# Patient Record
Sex: Male | Born: 1970 | Race: White | Hispanic: No | Marital: Married | Smoking: Never smoker
Health system: Southern US, Community
[De-identification: ages and names within clinical notes are randomized; demographics above are authoritative.]

## PROBLEM LIST (undated history)

## (undated) DIAGNOSIS — E785 Hyperlipidemia, unspecified: Secondary | ICD-10-CM

## (undated) DIAGNOSIS — R011 Cardiac murmur, unspecified: Secondary | ICD-10-CM

## (undated) DIAGNOSIS — G4730 Sleep apnea, unspecified: Secondary | ICD-10-CM

## (undated) HISTORY — DX: Cardiac murmur, unspecified: R01.1

## (undated) HISTORY — PX: APPENDECTOMY: SHX54

## (undated) HISTORY — PX: ARTHROSCOPIC REPAIR ACL: SUR80

## (undated) HISTORY — DX: Hyperlipidemia, unspecified: E78.5

## (undated) HISTORY — PX: KNEE ARTHROSCOPY: SUR90

---

## 2006-11-13 ENCOUNTER — Ambulatory Visit: Payer: Self-pay | Admitting: Family Medicine

## 2008-10-29 ENCOUNTER — Ambulatory Visit: Payer: Self-pay | Admitting: Family Medicine

## 2012-05-09 ENCOUNTER — Ambulatory Visit: Payer: Self-pay | Admitting: General Practice

## 2012-06-13 ENCOUNTER — Ambulatory Visit: Payer: Self-pay | Admitting: General Practice

## 2012-08-01 ENCOUNTER — Ambulatory Visit: Payer: Self-pay | Admitting: Family Medicine

## 2012-09-05 DIAGNOSIS — N4340 Spermatocele of epididymis, unspecified: Secondary | ICD-10-CM | POA: Insufficient documentation

## 2012-09-05 DIAGNOSIS — K4090 Unilateral inguinal hernia, without obstruction or gangrene, not specified as recurrent: Secondary | ICD-10-CM | POA: Insufficient documentation

## 2014-06-04 NOTE — Op Note (Signed)
PATIENT NAME:  Troy Gibson, Troy Gibson MR#:  [id number] DATE OF BIRTH:  1971/01/02  DATE OF PROCEDURE:  06/13/2012  PREOPERATIVE DIAGNOSIS:  Internal derangement of the right knee.   POSTOPERATIVE DIAGNOSIS:  Tear of the posterior horn of the medial meniscus, right knee.   PROCEDURE PERFORMED:  Right knee arthroscopy, partial medial meniscectomy.   SURGEON:  Laurice Record. Holley Bouche., MD  ANESTHESIA:  General.   ESTIMATED BLOOD LOSS:  Minimal.   TOURNIQUET TIME:  Not used.   DRAINS:  None.   INDICATIONS FOR SURGERY:  The patient is a 44 year old male who has been seen for complaints of medial pain and swelling of the right knee. He has a remote history of right ACL reconstruction and meniscus repair. The patient has not seen any significant improvement despite conservative and nonsurgical intervention. After a discussion of the risks and benefits of surgical intervention, the patient expressed understanding of the risks and benefits and agreed with plans for surgical intervention.   PROCEDURE IN DETAIL:  The patient was brought into the Operating Room and, after adequate general anesthesia was achieved, a tourniquet was placed on the patient's right thigh and the leg was placed in a leg holder. All by prominences were well padded. The patient's right knee and leg were cleaned and prepped with alcohol and DuraPrep draped in the usual sterile fashion. A "time-out" was performed as per usual protocol. The anticipated portal sites were injected with 0.25% Marcaine with epinephrine. An anterolateral portal was created and a cannula was inserted. The scope was inserted and the knee was distended with fluid using the  Stryker pump. The scope was advanced in the medial gutter into the medial compartment of the knee. Under visualization with the scope, an anteromedial portal was created and a hook probe was inserted. Inspection of the medial compartment showed the articular surface to be in good condition. The anterior  horn was visualized and probed and felt to be stable. A complex tear of the posterior horn of the medial meniscus was noted. This involved both a radial tear as well as a component of a horizontal cleavage tear. The area of the tear was debrided using meniscal punches and a 4.5-mm shaver. Final contouring was performed using a 50-degree ArthroCare wand. The remaining rim of meniscus was visualized and probed and felt to be stable. The articular surface was felt to be in good condition to both the femoral and tibial aspects. The scope was then advanced into the intercondylar region. The anterior cruciate ligament graft was visualized and probed and noted be stable. A Lachman's test performed while visualizing the graft showed the graft to be functioning well without any evidence of impingement. The scope was removed from the anterolateral portal and reinserted via the anteromedial portal so as to better visualize the lateral compartment. The articular surface the lateral compartment was in good condition. The lateral meniscus was visualized and probed and felt to be stable. Finally, the scope was positioned so as to visualize the patellofemoral articulation. Good patellar tracking was noted. The articular surface was in good condition. The knee was irrigated with copious amounts of fluid and then suctioned dry. The anterolateral portal was reapproximated using #3-0 nylon. A combination of 0.25% Marcaine with epinephrine and 4 mg morphine was injected via the scope. The scope was removed and the anteromedial portal was reapproximated using #3-0 nylon. A sterile dressing was applied followed by application of an ice wrap.   The patient tolerated the procedure well. He was  transported to the Recovery Room in stable condition.  ____________________________ Laurice Record. Holley Bouche., MD jph:jm D: 06/13/2012 17:34:19 ET T: 06/14/2012 10:10:37 ET JOB#: [id number]  cc: Jeneen Rinks P. Holley Bouche., MD, <Dictator> Laurice Record Holley Bouche  MD ELECTRONICALLY SIGNED 06/17/2012 21:53

## 2014-11-06 IMAGING — US US PELVIS LIMITED
1 series · 14 of 25 positions shown · non-contrast
Comparison: none

REASON FOR EXAM: teticular pain
COMMENTS:

[Series 1: us pelvis limited · 0.08mm/px · 14 of 45 slices shown]
[im 1/45]
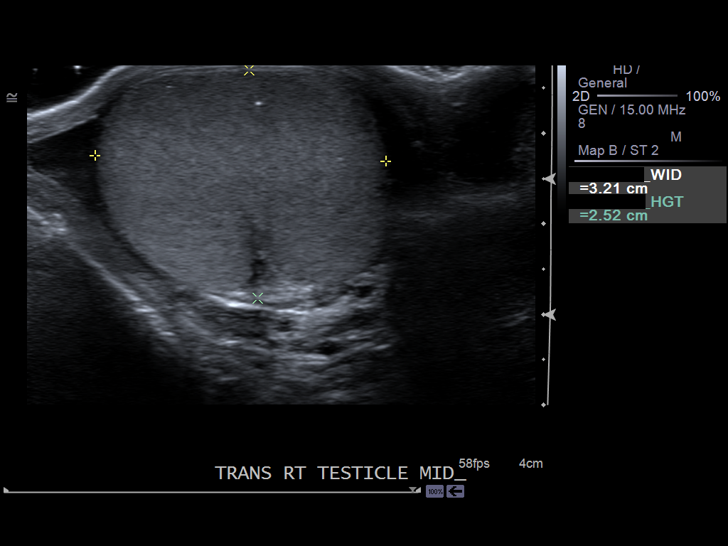
[im 4/45]
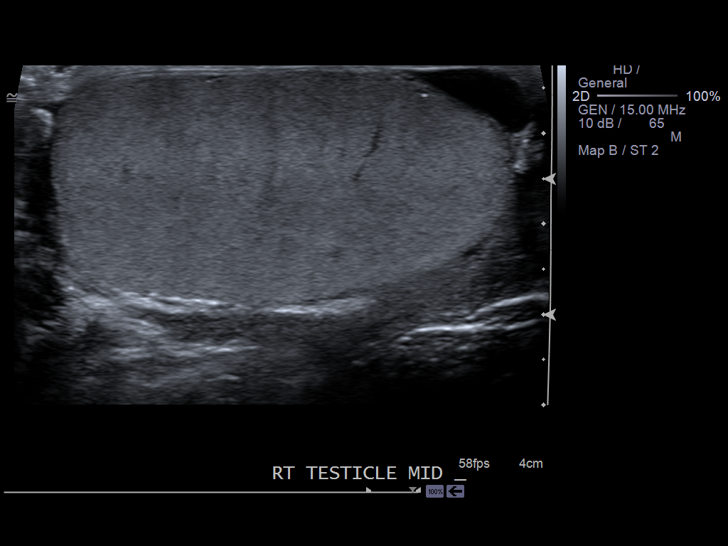
[im 8/45]
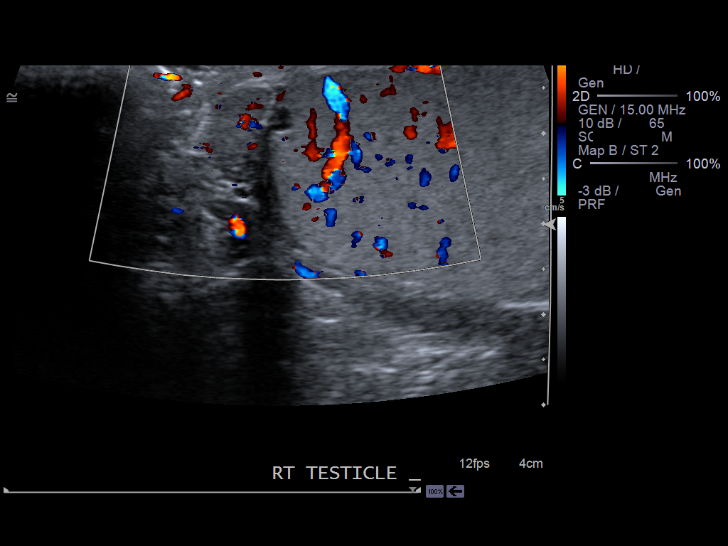
[im 12/45]
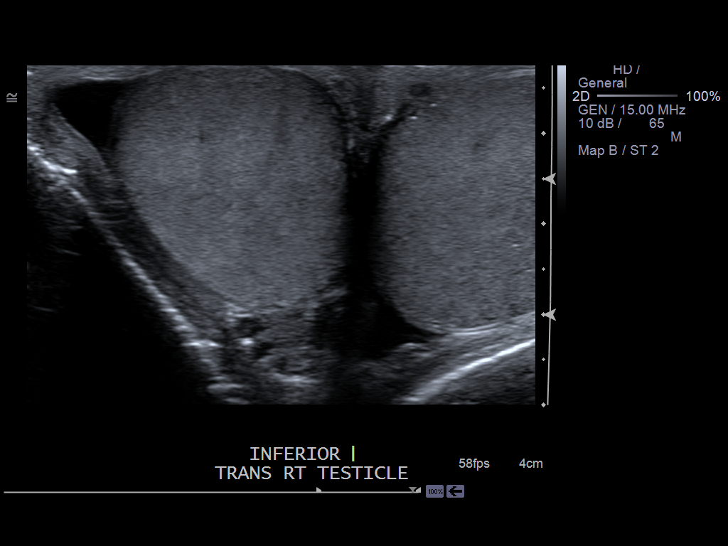
[im 15/45]
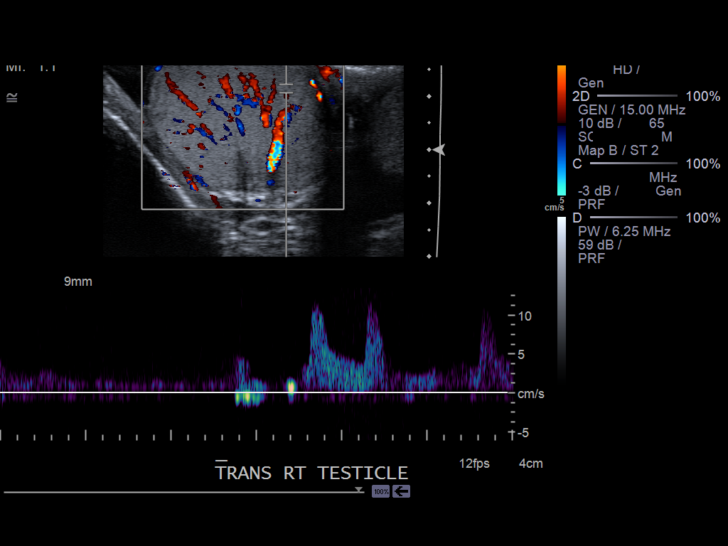
[im 17/45]
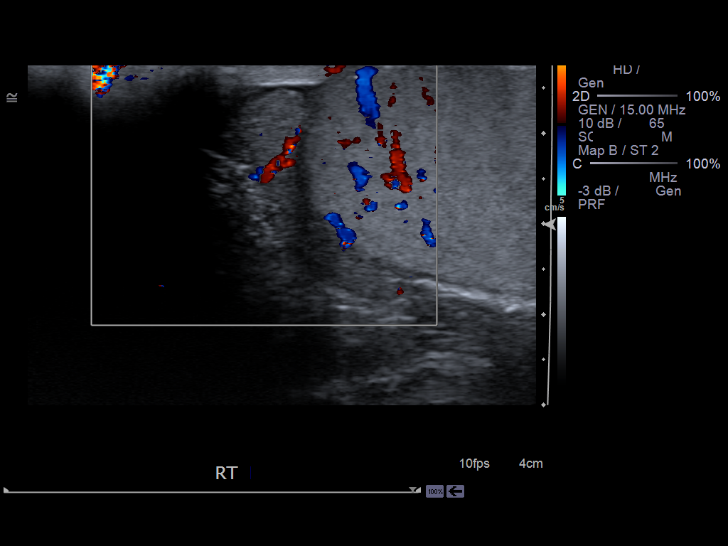
[im 21/45]
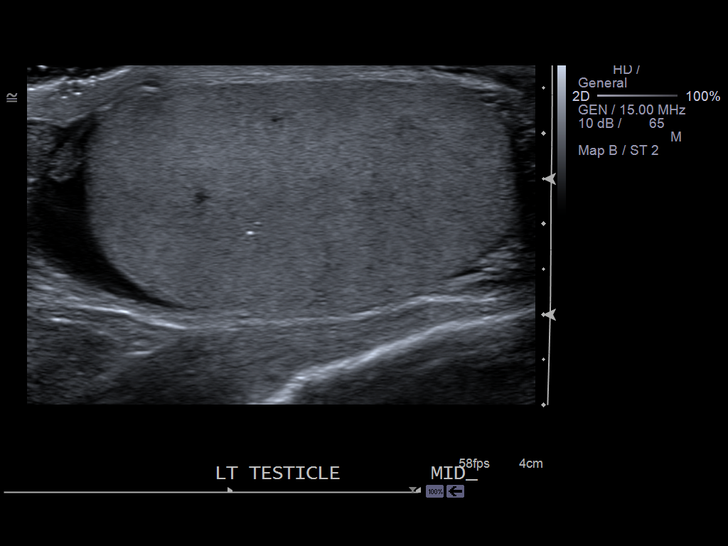
[im 24/45]
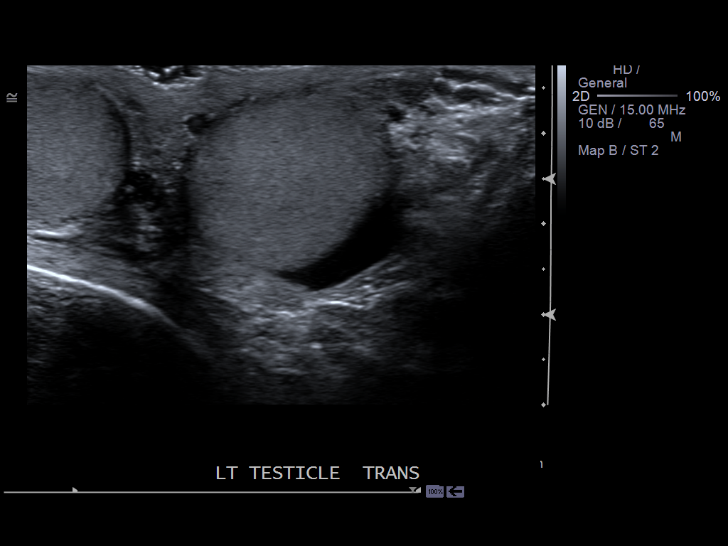
[im 28/45]
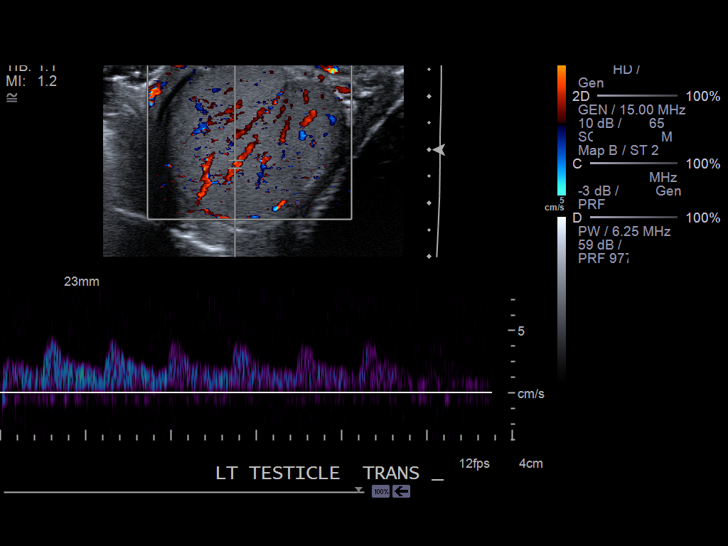
[im 30/45]
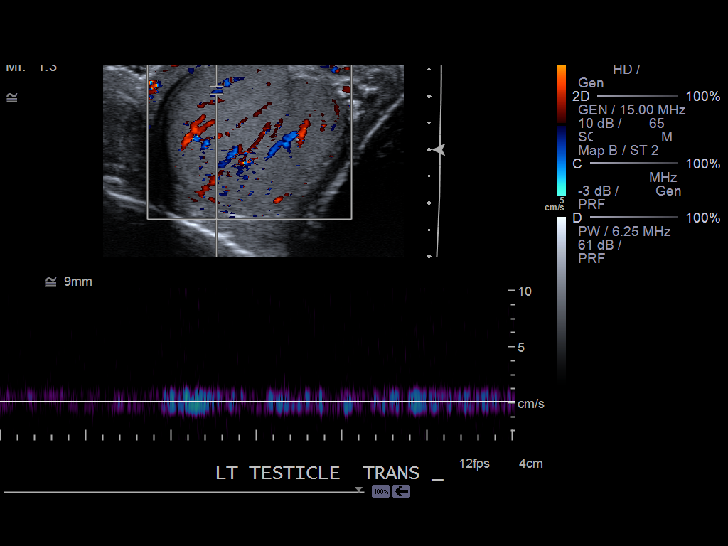
[im 34/45]
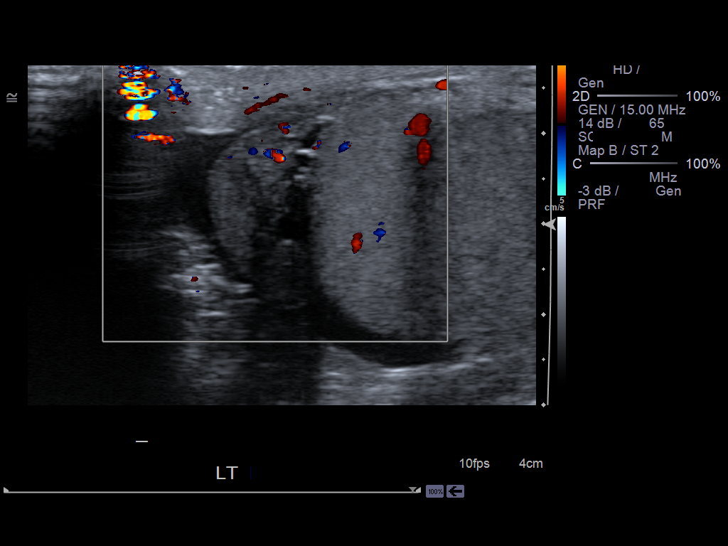
[im 37/45]
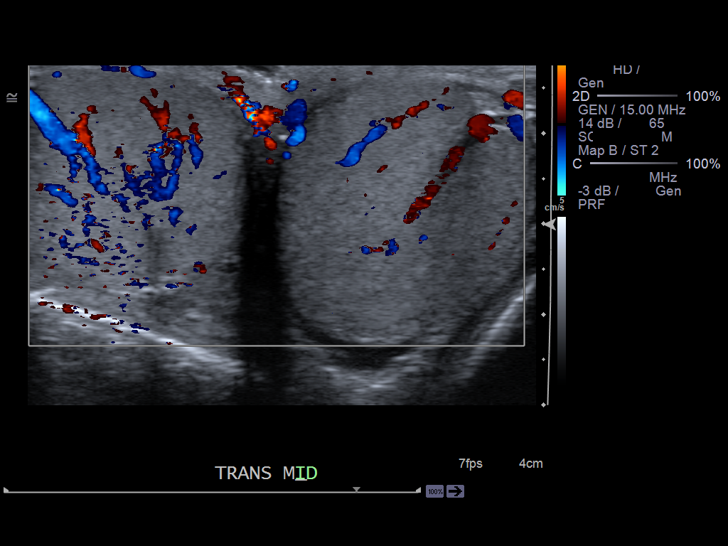
[im 41/45]
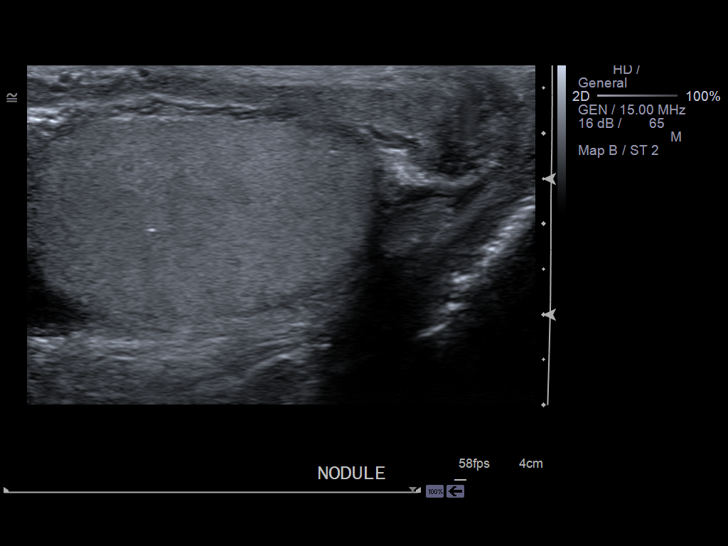
[im 45/45]
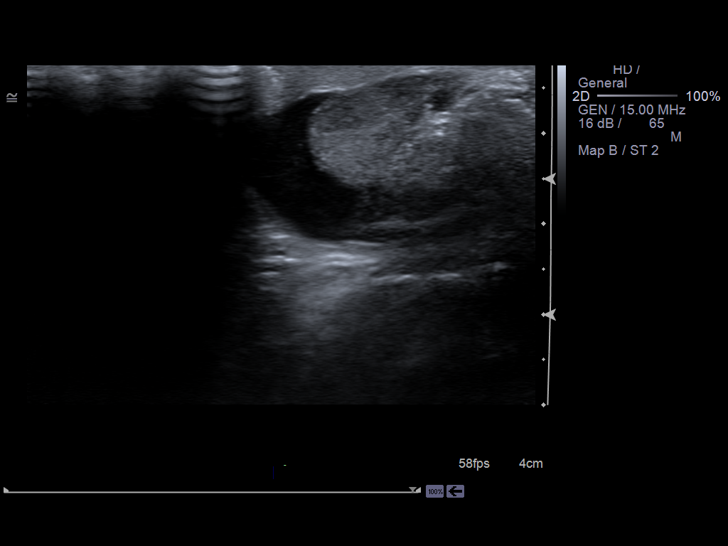

[14 of 25 positions shown; findings below may reference images not displayed]

PROCEDURE:     YOANA - YOANA TESTICULAR  - August 01, 2012  [DATE]

RESULT:     Scrotal ultrasound is performed. The right testicle measures
3.21 x 2.52 x 5.05 cm shows a small cyst at the periphery superiorly and
laterally measuring 2.5 x 2.2 x 2.2 mm. Testicular microlithiasis appears
present in both testicles. The left testicle measures 4.96 x 3.13 x 3.09 cm.
No other solid or cystic testicular lesions are evident. Arterial and venous
blood flow is documented in both testicles. There is a trace hydrocele on
the left. There is a small epididymal cyst on the left measuring 2.6 x 2.7 x
2.5 mm. There is fullness in the epididymal tail on the left side in the
area of the palpable nodule. Blood flow appears to be symmetric when
comparing the left testicle to the right.
IMPRESSION: 1. Fullness of the tail of the epididymis on the left at the site of the
palpable nodule.
2. Bilateral testicular microlithiasis.
3. Small left hydrocele and small left epididymal cyst.
4. Small cyst in the superior lateral aspect of the right testicle.
5. No evidence of testicular torsion.

[REDACTED]

## 2016-05-18 DIAGNOSIS — D2262 Melanocytic nevi of left upper limb, including shoulder: Secondary | ICD-10-CM | POA: Diagnosis not present

## 2016-06-05 DIAGNOSIS — G4733 Obstructive sleep apnea (adult) (pediatric): Secondary | ICD-10-CM | POA: Diagnosis not present

## 2016-06-05 DIAGNOSIS — J301 Allergic rhinitis due to pollen: Secondary | ICD-10-CM | POA: Diagnosis not present

## 2016-07-24 ENCOUNTER — Ambulatory Visit: Payer: 59 | Attending: Otolaryngology

## 2016-07-24 DIAGNOSIS — G4733 Obstructive sleep apnea (adult) (pediatric): Secondary | ICD-10-CM | POA: Diagnosis not present

## 2016-08-02 DIAGNOSIS — G4733 Obstructive sleep apnea (adult) (pediatric): Secondary | ICD-10-CM | POA: Diagnosis not present

## 2016-08-20 DIAGNOSIS — G4733 Obstructive sleep apnea (adult) (pediatric): Secondary | ICD-10-CM | POA: Diagnosis not present

## 2016-09-20 DIAGNOSIS — G4733 Obstructive sleep apnea (adult) (pediatric): Secondary | ICD-10-CM | POA: Diagnosis not present

## 2016-10-21 DIAGNOSIS — G4733 Obstructive sleep apnea (adult) (pediatric): Secondary | ICD-10-CM | POA: Diagnosis not present

## 2016-11-20 DIAGNOSIS — G4733 Obstructive sleep apnea (adult) (pediatric): Secondary | ICD-10-CM | POA: Diagnosis not present

## 2016-12-05 DIAGNOSIS — G4733 Obstructive sleep apnea (adult) (pediatric): Secondary | ICD-10-CM | POA: Diagnosis not present

## 2016-12-21 DIAGNOSIS — G4733 Obstructive sleep apnea (adult) (pediatric): Secondary | ICD-10-CM | POA: Diagnosis not present

## 2017-01-20 DIAGNOSIS — G4733 Obstructive sleep apnea (adult) (pediatric): Secondary | ICD-10-CM | POA: Diagnosis not present

## 2017-02-20 DIAGNOSIS — G4733 Obstructive sleep apnea (adult) (pediatric): Secondary | ICD-10-CM | POA: Diagnosis not present

## 2017-03-01 DIAGNOSIS — D225 Melanocytic nevi of trunk: Secondary | ICD-10-CM | POA: Diagnosis not present

## 2017-03-11 DIAGNOSIS — L723 Sebaceous cyst: Secondary | ICD-10-CM | POA: Diagnosis not present

## 2017-03-15 DIAGNOSIS — L728 Other follicular cysts of the skin and subcutaneous tissue: Secondary | ICD-10-CM | POA: Diagnosis not present

## 2017-03-23 DIAGNOSIS — G4733 Obstructive sleep apnea (adult) (pediatric): Secondary | ICD-10-CM | POA: Diagnosis not present

## 2017-04-20 DIAGNOSIS — G4733 Obstructive sleep apnea (adult) (pediatric): Secondary | ICD-10-CM | POA: Diagnosis not present

## 2017-05-21 DIAGNOSIS — G4733 Obstructive sleep apnea (adult) (pediatric): Secondary | ICD-10-CM | POA: Diagnosis not present

## 2017-07-12 DIAGNOSIS — M79644 Pain in right finger(s): Secondary | ICD-10-CM | POA: Diagnosis not present

## 2017-07-23 DIAGNOSIS — M79644 Pain in right finger(s): Secondary | ICD-10-CM | POA: Diagnosis not present

## 2017-07-23 DIAGNOSIS — M25641 Stiffness of right hand, not elsewhere classified: Secondary | ICD-10-CM | POA: Diagnosis not present

## 2017-07-23 DIAGNOSIS — M20011 Mallet finger of right finger(s): Secondary | ICD-10-CM | POA: Insufficient documentation

## 2017-07-31 DIAGNOSIS — M25641 Stiffness of right hand, not elsewhere classified: Secondary | ICD-10-CM | POA: Diagnosis not present

## 2017-07-31 DIAGNOSIS — M20011 Mallet finger of right finger(s): Secondary | ICD-10-CM | POA: Diagnosis not present

## 2017-08-13 DIAGNOSIS — M20011 Mallet finger of right finger(s): Secondary | ICD-10-CM | POA: Diagnosis not present

## 2017-08-13 DIAGNOSIS — M79644 Pain in right finger(s): Secondary | ICD-10-CM | POA: Diagnosis not present

## 2017-08-28 DIAGNOSIS — M20011 Mallet finger of right finger(s): Secondary | ICD-10-CM | POA: Diagnosis not present

## 2017-08-28 DIAGNOSIS — M25641 Stiffness of right hand, not elsewhere classified: Secondary | ICD-10-CM | POA: Diagnosis not present

## 2017-09-10 DIAGNOSIS — M79644 Pain in right finger(s): Secondary | ICD-10-CM | POA: Diagnosis not present

## 2017-09-10 DIAGNOSIS — M20011 Mallet finger of right finger(s): Secondary | ICD-10-CM | POA: Diagnosis not present

## 2017-09-10 DIAGNOSIS — M25641 Stiffness of right hand, not elsewhere classified: Secondary | ICD-10-CM | POA: Diagnosis not present

## 2017-11-26 DIAGNOSIS — M20011 Mallet finger of right finger(s): Secondary | ICD-10-CM | POA: Diagnosis not present

## 2017-11-26 DIAGNOSIS — M79644 Pain in right finger(s): Secondary | ICD-10-CM | POA: Diagnosis not present

## 2017-12-31 DIAGNOSIS — N401 Enlarged prostate with lower urinary tract symptoms: Secondary | ICD-10-CM | POA: Diagnosis not present

## 2017-12-31 DIAGNOSIS — N469 Male infertility, unspecified: Secondary | ICD-10-CM | POA: Diagnosis not present

## 2017-12-31 DIAGNOSIS — Z125 Encounter for screening for malignant neoplasm of prostate: Secondary | ICD-10-CM | POA: Diagnosis not present

## 2018-03-07 DIAGNOSIS — D2261 Melanocytic nevi of right upper limb, including shoulder: Secondary | ICD-10-CM | POA: Diagnosis not present

## 2018-03-07 DIAGNOSIS — D2262 Melanocytic nevi of left upper limb, including shoulder: Secondary | ICD-10-CM | POA: Diagnosis not present

## 2018-03-07 DIAGNOSIS — D225 Melanocytic nevi of trunk: Secondary | ICD-10-CM | POA: Diagnosis not present

## 2018-03-12 ENCOUNTER — Encounter: Payer: Self-pay | Admitting: *Deleted

## 2018-03-12 DIAGNOSIS — R011 Cardiac murmur, unspecified: Secondary | ICD-10-CM | POA: Insufficient documentation

## 2018-03-12 DIAGNOSIS — Z0000 Encounter for general adult medical examination without abnormal findings: Secondary | ICD-10-CM | POA: Diagnosis not present

## 2018-03-12 DIAGNOSIS — G4733 Obstructive sleep apnea (adult) (pediatric): Secondary | ICD-10-CM | POA: Diagnosis not present

## 2018-03-12 DIAGNOSIS — E7800 Pure hypercholesterolemia, unspecified: Secondary | ICD-10-CM | POA: Diagnosis not present

## 2018-03-21 DIAGNOSIS — R011 Cardiac murmur, unspecified: Secondary | ICD-10-CM | POA: Diagnosis not present

## 2018-04-10 ENCOUNTER — Ambulatory Visit: Payer: 59 | Admitting: Gastroenterology

## 2018-05-13 ENCOUNTER — Ambulatory Visit: Payer: 59 | Admitting: Gastroenterology

## 2019-01-06 ENCOUNTER — Ambulatory Visit: Payer: 59 | Admitting: Gastroenterology

## 2019-01-06 ENCOUNTER — Ambulatory Visit (INDEPENDENT_AMBULATORY_CARE_PROVIDER_SITE_OTHER): Payer: Self-pay | Admitting: Gastroenterology

## 2019-01-06 ENCOUNTER — Encounter: Payer: Self-pay | Admitting: Gastroenterology

## 2019-01-06 ENCOUNTER — Other Ambulatory Visit: Payer: Self-pay

## 2019-01-06 VITALS — BP 112/60 | HR 55 | Temp 98.3°F | Ht 69.0 in | Wt 242.0 lb

## 2019-01-06 DIAGNOSIS — Z1211 Encounter for screening for malignant neoplasm of colon: Secondary | ICD-10-CM

## 2019-02-18 ENCOUNTER — Other Ambulatory Visit
Admission: RE | Admit: 2019-02-18 | Discharge: 2019-02-18 | Disposition: A | Discharge status: Home / Self Care | Payer: 59 | Source: Ambulatory Visit | Attending: Gastroenterology | Admitting: Gastroenterology

## 2019-02-18 ENCOUNTER — Other Ambulatory Visit: Payer: Self-pay

## 2019-02-18 DIAGNOSIS — Z20822 Contact with and (suspected) exposure to covid-19: Secondary | ICD-10-CM | POA: Diagnosis not present

## 2019-02-18 DIAGNOSIS — Z01812 Encounter for preprocedural laboratory examination: Secondary | ICD-10-CM | POA: Diagnosis present

## 2019-02-18 LAB — SARS CORONAVIRUS 2 (TAT 6-24 HRS): SARS Coronavirus 2: NEGATIVE

## 2019-02-20 ENCOUNTER — Ambulatory Visit: Payer: 59 | Admitting: Anesthesiology

## 2019-02-20 ENCOUNTER — Encounter: Payer: Self-pay | Admitting: Gastroenterology

## 2019-02-20 ENCOUNTER — Ambulatory Visit
Admission: RE | Admit: 2019-02-20 | Discharge: 2019-02-20 | Disposition: A | Discharge status: Home / Self Care | Payer: 59 | Attending: Gastroenterology | Admitting: Gastroenterology

## 2019-02-20 ENCOUNTER — Other Ambulatory Visit: Payer: Self-pay

## 2019-02-20 ENCOUNTER — Encounter
Admission: RE | Disposition: A | Discharge status: Home / Self Care | Payer: Self-pay | Source: Home / Self Care | Attending: Gastroenterology

## 2019-02-20 DIAGNOSIS — Z1211 Encounter for screening for malignant neoplasm of colon: Secondary | ICD-10-CM

## 2019-02-20 DIAGNOSIS — E7800 Pure hypercholesterolemia, unspecified: Secondary | ICD-10-CM | POA: Diagnosis not present

## 2019-02-20 DIAGNOSIS — E785 Hyperlipidemia, unspecified: Secondary | ICD-10-CM | POA: Insufficient documentation

## 2019-02-20 DIAGNOSIS — D123 Benign neoplasm of transverse colon: Secondary | ICD-10-CM | POA: Insufficient documentation

## 2019-02-20 DIAGNOSIS — Z791 Long term (current) use of non-steroidal anti-inflammatories (NSAID): Secondary | ICD-10-CM | POA: Diagnosis not present

## 2019-02-20 DIAGNOSIS — G4733 Obstructive sleep apnea (adult) (pediatric): Secondary | ICD-10-CM | POA: Insufficient documentation

## 2019-02-20 HISTORY — DX: Sleep apnea, unspecified: G47.30

## 2019-02-20 HISTORY — PX: COLONOSCOPY WITH PROPOFOL: SHX5780

## 2019-02-20 SURGERY — COLONOSCOPY WITH PROPOFOL
Anesthesia: General

## 2019-02-20 MED ORDER — PROPOFOL 500 MG/50ML IV EMUL
INTRAVENOUS | Status: AC
  Filled 2019-02-20: qty 150

## 2019-02-20 MED ORDER — PROPOFOL 500 MG/50ML IV EMUL
INTRAVENOUS | Status: DC | PRN
  Administered 2019-02-20: 140 ug/kg/min via INTRAVENOUS

## 2019-02-20 MED ORDER — PROPOFOL 500 MG/50ML IV EMUL
INTRAVENOUS | Status: AC
  Filled 2019-02-20: qty 50

## 2019-02-20 MED ORDER — SODIUM CHLORIDE 0.9 % IV SOLN
INTRAVENOUS | Status: DC
  Administered 2019-02-20: 08:00:00 1000 mL via INTRAVENOUS

## 2019-02-20 MED ORDER — PROPOFOL 10 MG/ML IV BOLUS
INTRAVENOUS | Status: DC | PRN
  Administered 2019-02-20: 100 mg via INTRAVENOUS
  Administered 2019-02-20: 50 mg via INTRAVENOUS

## 2019-02-20 NOTE — H&P (Signed)
Troy Lame, MD [name] [name], [name] [address] Phone: [phone] Fax : [phone]  Primary Care Physician:  [name], MD Primary Gastroenterologist:  Dr. [name]  Pre-Procedure History & Physical: HPI:  Troy Gibson is a 49 y.o. male is here for a screening colonoscopy.   Past Medical History:  Diagnosis Date  . Heart murmur   . Hyperlipemia   . Sleep apnea     Past Surgical History:  Procedure Laterality Date  . APPENDECTOMY    . ARTHROSCOPIC REPAIR ACL    . KNEE ARTHROSCOPY      Prior to Admission medications   Medication Sig Start Date End Date Taking? Authorizing Provider  ibuprofen (ADVIL) 200 MG tablet Take by mouth.    [provider]  Multiple Vitamin (MULTI-VITAMIN) tablet Take by mouth.    [provider]    Allergies as of 01/07/2019 - Review Complete 01/06/2019  Allergen Reaction Noted  . Wasp venom protein Itching 03/12/2018    Family History  Problem Relation Age of Onset  . Cancer Mother   . Diabetes Mother   . Cancer Father   . Liver disease Father     Social History   Socioeconomic History  . Marital status: Married    Spouse name: Not on file  . Number of children: Not on file  . Years of education: Not on file  . Highest education level: Not on file  Occupational History  . Not on file  Tobacco Use  . Smoking status: Never Smoker  . Smokeless tobacco: Never Used  Substance and Sexual Activity  . Alcohol use: Yes  . Drug use: Never  . Sexual activity: Not on file  Other Topics Concern  . Not on file  Social History Narrative  . Not on file   Social Determinants of Health   Financial Resource Strain:   . Difficulty of Paying Living Expenses: Not on file  Food Insecurity:   . Worried About Charity fundraiser in the Last Year: Not on file  . Ran Out of Food in the Last Year: Not on file  Transportation Needs:   . Lack of Transportation (Medical): Not on file  . Lack of  Transportation (Non-Medical): Not on file  Physical Activity:   . Days of Exercise per Week: Not on file  . Minutes of Exercise per Session: Not on file  Stress:   . Feeling of Stress : Not on file  Social Connections:   . Frequency of Communication with Friends and Family: Not on file  . Frequency of Social Gatherings with Friends and Family: Not on file  . Attends Religious Services: Not on file  . Active Member of Clubs or Organizations: Not on file  . Attends Archivist Meetings: Not on file  . Marital Status: Not on file  Intimate Partner Violence:   . Fear of Current or Ex-Partner: Not on file  . Emotionally Abused: Not on file  . Physically Abused: Not on file  . Sexually Abused: Not on file    Review of Systems: See HPI, otherwise negative ROS  Physical Exam: BP (!) 124/93   Pulse (!) 53   Temp (!) 96.7 F (35.9 C) (Skin)   Resp 15   Ht 5' 8.5" (1.74 m)   Wt 107.8 kg   SpO2 100%   BMI 35.62 kg/m  General:   Alert,  pleasant and cooperative in NAD Head:  Normocephalic and atraumatic. Neck:  Supple; no masses or thyromegaly. Lungs:  Clear throughout to auscultation.    Heart:  Regular rate and rhythm. Abdomen:  Soft, nontender and nondistended. Normal bowel sounds, without guarding, and without rebound.   Neurologic:  Alert and  oriented x4;  grossly normal neurologically.  Impression/Plan: Troy Gibson is now here to undergo a screening colonoscopy.  Risks, benefits, and alternatives regarding colonoscopy have been reviewed with the patient.  Questions have been answered.  All parties agreeable.

## 2019-02-20 NOTE — Anesthesia Preprocedure Evaluation (Signed)
Anesthesia Evaluation  Patient identified by MRN, date of birth, ID band Patient awake    Reviewed: Allergy & Precautions, NPO status , Patient's Chart, lab work & pertinent test results  History of Anesthesia Complications Negative for: history of anesthetic complications  Airway Mallampati: II  TM Distance: >3 FB Neck ROM: Full    Dental  (+) Poor Dentition   Pulmonary sleep apnea and Continuous Positive Airway Pressure Ventilation , neg COPD,    breath sounds clear to auscultation- rhonchi (-) wheezing      Cardiovascular (-) hypertension(-) CAD, (-) Past MI, (-) Cardiac Stents and (-) CABG  Rhythm:Regular Rate:Normal - Systolic murmurs and - Diastolic murmurs    Neuro/Psych neg Seizures negative neurological ROS  negative psych ROS   GI/Hepatic negative GI ROS, Neg liver ROS,   Endo/Other  negative endocrine ROSneg diabetes  Renal/GU negative Renal ROS     Musculoskeletal negative musculoskeletal ROS (+)   Abdominal (+) + obese,   Peds  Hematology negative hematology ROS (+)   Anesthesia Other Findings Past Medical History: No date: Heart murmur No date: Hyperlipemia No date: Sleep apnea   Reproductive/Obstetrics                             Anesthesia Physical Anesthesia Plan  ASA: II  Anesthesia Plan: General   Post-op Pain Management:    Induction: Intravenous  PONV Risk Score and Plan: 1 and Propofol infusion  Airway Management Planned: Natural Airway  Additional Equipment:   Intra-op Plan:   Post-operative Plan:   Informed Consent: I have reviewed the patients History and Physical, chart, labs and discussed the procedure including the risks, benefits and alternatives for the proposed anesthesia with the patient or authorized representative who has indicated his/her understanding and acceptance.     Dental advisory given  Plan Discussed with: CRNA and  Anesthesiologist  Anesthesia Plan Comments:         Anesthesia Quick Evaluation

## 2019-02-20 NOTE — Anesthesia Postprocedure Evaluation (Signed)
Anesthesia Post Note  Patient: Troy Gibson  Procedure(s) Performed: COLONOSCOPY WITH PROPOFOL (N/A )  Patient location during evaluation: Endoscopy Anesthesia Type: General Level of consciousness: awake and alert and oriented Pain management: pain level controlled Vital Signs Assessment: post-procedure vital signs reviewed and stable Respiratory status: spontaneous breathing, nonlabored ventilation and respiratory function stable Cardiovascular status: blood pressure returned to baseline and stable Postop Assessment: no signs of nausea or vomiting Anesthetic complications: no     Last Vitals:  Vitals:   02/20/19 0841 02/20/19 0851  BP: 122/77 109/70  Pulse:    Resp:    Temp:    SpO2:      Last Pain:  Vitals:   02/20/19 0851  TempSrc:   PainSc: 0-No pain                 Keoni Havey

## 2019-02-20 NOTE — Transfer of Care (Signed)
Immediate Anesthesia Transfer of Care Note  Patient: Troy Gibson  Procedure(s) Performed: COLONOSCOPY WITH PROPOFOL (N/A )  Patient Location: PACU  Anesthesia Type:General  Level of Consciousness: sedated  Airway & Oxygen Therapy: Patient Spontanous Breathing  Post-op Assessment: Report given to RN and Post -op Vital signs reviewed and stable  Post vital signs: Reviewed and stable  Last Vitals:  Vitals Value Taken Time  BP 101/63 02/20/19 0824  Temp 36.6 C 02/20/19 0821  Pulse 80 02/20/19 0824  Resp 13 02/20/19 0824  SpO2 99 % 02/20/19 0824  Vitals shown include unvalidated device data.  Last Pain:  Vitals:   02/20/19 0821  TempSrc: Temporal  PainSc: Asleep         Complications: No apparent anesthesia complications

## 2019-02-20 NOTE — Op Note (Signed)
[facility] Gastroenterology Patient Name: Troy Gibson Procedure Date: 02/20/2019 8:00 AM MRN: [id number] Account #: [id number] Date of Birth: [birth year] Admit Type: Outpatient Age: 49 Room: [facility] ENDO ROOM 4 Gender: Male Note Status: Finalized Procedure:             Colonoscopy Indications:           Screening for colorectal malignant neoplasm Providers:             [name] MD, MD Medicines:             Propofol per Anesthesia Complications:         No immediate complications. Procedure:             Pre-Anesthesia Assessment:                        - Prior to the procedure, a History and Physical was                         performed, and patient medications and allergies were                         reviewed. The patient's tolerance of previous                         anesthesia was also reviewed. The risks and benefits                         of the procedure and the sedation options and risks                         were discussed with the patient. All questions were                         answered, and informed consent was obtained. Prior                         Anticoagulants: The patient has taken no previous                         anticoagulant or antiplatelet agents. ASA Grade                         Assessment: II - A patient with mild systemic disease.                         After reviewing the risks and benefits, the patient                         was deemed in satisfactory condition to undergo the                         procedure.                        After obtaining informed consent, the colonoscope was                         passed under direct vision. Throughout the procedure,  the patient's blood pressure, pulse, and oxygen                         saturations were monitored continuously. The                         Colonoscope was introduced through the anus and                         advanced to the the cecum,  identified by appendiceal                         orifice and ileocecal valve. The colonoscopy was                         performed without difficulty. The patient tolerated                         the procedure well. The quality of the bowel                         preparation was excellent. Findings:      The perianal and digital rectal examinations were normal.      A 4 mm polyp was found in the transverse colon. The polyp was sessile.       The polyp was removed with a cold snare. Resection and retrieval were       complete. Impression:            - One 4 mm polyp in the transverse colon, removed with                         a cold snare. Resected and retrieved. Recommendation:        - Discharge patient to home.                        - Resume previous diet.                        - Continue present medications.                        - Await pathology results.                        - Repeat colonoscopy in 5 years if polyp adenoma and                         10 years if hyperplastic Procedure Code(s):     --- Professional ---                        [phone], Colonoscopy, flexible; with removal of                         tumor(s), polyp(s), or other lesion(s) by snare                         technique Diagnosis Code(s):     --- Professional ---  Z12.11, Encounter for screening for malignant neoplasm                         of colon                        K63.5, Polyp of colon CPT copyright 2019 American Medical Association. All rights reserved. The codes documented in this report are preliminary and upon coder review may  be revised to meet current compliance requirements. [name] MD, MD 02/20/2019 8:19:24 AM This report has been signed electronically. Number of Addenda: 0 Note Initiated On: 02/20/2019 8:00 AM Scope Withdrawal Time: 0 hours 11 minutes 14 seconds  Total Procedure Duration: 0 hours 14 minutes 14 seconds  Estimated Blood Loss:  Estimated  blood loss: none.      [facility]

## 2019-02-23 LAB — SURGICAL PATHOLOGY

## 2019-02-24 ENCOUNTER — Encounter: Payer: Self-pay | Admitting: Gastroenterology

## 2019-04-16 ENCOUNTER — Ambulatory Visit: Payer: 59 | Attending: Internal Medicine

## 2019-04-16 DIAGNOSIS — Z20822 Contact with and (suspected) exposure to covid-19: Secondary | ICD-10-CM

## 2019-04-17 LAB — NOVEL CORONAVIRUS, NAA: SARS-CoV-2, NAA: NOT DETECTED

## 2019-09-30 ENCOUNTER — Other Ambulatory Visit: Payer: 59

## 2019-09-30 ENCOUNTER — Other Ambulatory Visit: Payer: Self-pay

## 2019-09-30 DIAGNOSIS — Z20822 Contact with and (suspected) exposure to covid-19: Secondary | ICD-10-CM

## 2019-10-01 LAB — NOVEL CORONAVIRUS, NAA: SARS-CoV-2, NAA: NOT DETECTED

## 2019-10-01 LAB — SARS-COV-2, NAA 2 DAY TAT

## 2023-05-22 ENCOUNTER — Telehealth: Payer: Self-pay

## 2023-05-22 DIAGNOSIS — Z1211 Encounter for screening for malignant neoplasm of colon: Secondary | ICD-10-CM

## 2023-05-22 NOTE — Telephone Encounter (Signed)
 Colonoscopy referral received.  Contacted patient to inform him referral was received, however his colonoscopy is not due until Jan 2026 with Dr. Servando Snare.  Informed him that I will make a reminder to call him back in December to schedule for Jan 2026.  Thanks, [place], [place]

## 2024-01-20 ENCOUNTER — Other Ambulatory Visit: Payer: Self-pay

## 2024-01-20 DIAGNOSIS — Z1211 Encounter for screening for malignant neoplasm of colon: Secondary | ICD-10-CM

## 2024-01-20 MED ORDER — NA SULFATE-K SULFATE-MG SULF 17.5-3.13-1.6 GM/177ML PO SOLN: 1.0000 | Freq: Once | ORAL | 0 refills | Status: AC

## 2024-01-20 NOTE — Telephone Encounter (Signed)
 Gastroenterology Pre-Procedure Review  Request Date: 03/31/24 Requesting Physician: Dr. Jinny  PATIENT REVIEW QUESTIONS: The patient responded to the following health history questions as indicated:    1. Are you having any GI issues? no 2. Do you have a personal history of Polyps? yes (LAST COLONOSCOPY performed by dr jinny 02/20/19 recommended repeat in 5 years) 3. Do you have a family history of Colon Cancer or Polyps? no 4. Diabetes Mellitus? no 5. Joint replacements in the past 12 months?no 6. Major health problems in the past 3 months?no 7. Any artificial heart valves, MVP, or defibrillator?no    MEDICATIONS & ALLERGIES:    Patient reports the following regarding taking any anticoagulation/antiplatelet therapy:   Plavix, Coumadin, Eliquis, Xarelto, Lovenox, Pradaxa, Brilinta, or Effient? no Aspirin? no  Patient confirms/reports the following medications:  Current Outpatient Medications  Medication Sig Dispense Refill   ibuprofen (ADVIL) 200 MG tablet Take by mouth.     Multiple Vitamin (MULTI-VITAMIN) tablet Take by mouth.     No current facility-administered medications for this visit.    Patient confirms/reports the following allergies:  Allergies  Allergen Reactions   Wasp Venom Protein Itching    No orders of the defined types were placed in this encounter.   AUTHORIZATION INFORMATION Primary Insurance: 1D#: Group #:  Secondary Insurance: 1D#: Group #:  SCHEDULE INFORMATION: Date: 03/31/14 Time: Location: [facility]

## 2024-01-20 NOTE — Addendum Note (Signed)
 Addended by: CURTISS ROSALINE RAMAN on: 01/20/2024 08:40 AM   Modules accepted: Orders

## 2024-03-31 ENCOUNTER — Ambulatory Visit: Admit: 2024-03-31 | Payer: Self-pay | Admitting: Gastroenterology
